# Patient Record
Sex: Female | Born: 1970 | Race: White | Hispanic: No | Marital: Married | Smoking: Never smoker
Health system: Southern US, Community
[De-identification: ages and names within clinical notes are randomized; demographics above are authoritative.]

---

## 2016-06-07 ENCOUNTER — Encounter: Payer: Self-pay | Admitting: Emergency Medicine

## 2016-06-07 ENCOUNTER — Emergency Department: Payer: BLUE CROSS/BLUE SHIELD

## 2016-06-07 ENCOUNTER — Emergency Department
Admission: EM | Admit: 2016-06-07 | Discharge: 2016-06-07 | Disposition: A | Discharge status: Home / Self Care | Payer: BLUE CROSS/BLUE SHIELD | Attending: Emergency Medicine | Admitting: Emergency Medicine

## 2016-06-07 DIAGNOSIS — R0789 Other chest pain: Secondary | ICD-10-CM | POA: Insufficient documentation

## 2016-06-07 DIAGNOSIS — M791 Myalgia: Secondary | ICD-10-CM | POA: Insufficient documentation

## 2016-06-07 DIAGNOSIS — M7918 Myalgia, other site: Secondary | ICD-10-CM

## 2016-06-07 DIAGNOSIS — Y939 Activity, unspecified: Secondary | ICD-10-CM | POA: Insufficient documentation

## 2016-06-07 DIAGNOSIS — Y92410 Unspecified street and highway as the place of occurrence of the external cause: Secondary | ICD-10-CM | POA: Insufficient documentation

## 2016-06-07 DIAGNOSIS — Y999 Unspecified external cause status: Secondary | ICD-10-CM | POA: Insufficient documentation

## 2016-06-07 MED ORDER — TRAMADOL HCL 50 MG PO TABS: 50.0000 mg | ORAL_TABLET | Freq: Two times a day (BID) | ORAL | 0 refills | Status: AC

## 2016-06-07 MED ORDER — KETOROLAC TROMETHAMINE 60 MG/2ML IM SOLN
30.0000 mg | Freq: Once | INTRAMUSCULAR | Status: AC
  Administered 2016-06-07: 30 mg via INTRAMUSCULAR
  Filled 2016-06-07: qty 2

## 2016-06-07 MED ORDER — DIAZEPAM 2 MG PO TABS: 2.0000 mg | ORAL_TABLET | Freq: Three times a day (TID) | ORAL | 0 refills | Status: AC | PRN

## 2016-06-07 MED ORDER — DIAZEPAM 5 MG PO TABS
5.0000 mg | ORAL_TABLET | Freq: Once | ORAL | Status: AC
  Administered 2016-06-07: 5 mg via ORAL
  Filled 2016-06-07: qty 1

## 2016-06-07 MED ORDER — KETOROLAC TROMETHAMINE 10 MG PO TABS: 10.0000 mg | ORAL_TABLET | Freq: Three times a day (TID) | ORAL | 0 refills | Status: AC
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
## 2016-06-07 NOTE — Discharge Instructions (Signed)
Your exam and x-ray are normal at this time. Continue to monitor your symptoms and return as needed. Take the prescription meds as directed. Apply ice to any sore muscles. Follow-up with your provider for ongoing treatment.
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
## 2016-06-07 NOTE — ED Notes (Signed)
Pt given warm blanket  Family at bedside
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
